# Patient Record
Sex: Male | Born: 1960 | Race: Black or African American | Hispanic: No | Marital: Single
Health system: Southern US, Community
[De-identification: ages and names within clinical notes are randomized; demographics above are authoritative.]

---

## 2009-05-02 ENCOUNTER — Inpatient Hospital Stay: Payer: Self-pay | Admitting: Unknown Physician Specialty

## 2009-05-12 ENCOUNTER — Inpatient Hospital Stay: Payer: Self-pay | Admitting: Unknown Physician Specialty

## 2009-07-01 ENCOUNTER — Inpatient Hospital Stay: Payer: Self-pay | Admitting: Psychiatry

## 2011-09-04 ENCOUNTER — Inpatient Hospital Stay: Payer: Self-pay | Admitting: Psychiatry

## 2011-09-04 LAB — COMPREHENSIVE METABOLIC PANEL
Albumin: 4 g/dL (ref 3.4–5.0)
Bilirubin,Total: 0.8 mg/dL (ref 0.2–1.0)
Chloride: 103 mmol/L (ref 98–107)
Co2: 24 mmol/L (ref 21–32)
Creatinine: 1.34 mg/dL — ABNORMAL HIGH (ref 0.60–1.30)
EGFR (African American): >60
EGFR (Non-African Amer.): >60
Osmolality: 281 (ref 275–301)
SGOT(AST): 46 U/L — ABNORMAL HIGH (ref 15–37)
SGPT (ALT): 24 U/L
Sodium: 139 mmol/L (ref 136–145)
Total Protein: 8.3 g/dL — ABNORMAL HIGH (ref 6.4–8.2)

## 2011-09-04 LAB — URINALYSIS, COMPLETE
Bilirubin,UR: NEGATIVE
Glucose,UR: NEGATIVE mg/dL (ref 0–75)
Nitrite: NEGATIVE
Protein: NEGATIVE
RBC,UR: 2 /HPF (ref 0–5)
Specific Gravity: 1.028 (ref 1.003–1.030)
Squamous Epithelial: NONE SEEN
WBC UR: 1 /HPF (ref 0–5)

## 2011-09-04 LAB — TROPONIN I: Troponin-I: <0.02 ng/mL

## 2011-09-04 LAB — SALICYLATE LEVEL: Salicylates, Serum: <1.7 mg/dL

## 2011-09-04 LAB — DRUG SCREEN, URINE
Amphetamines, Ur Screen: NEGATIVE (ref ?–1000)
Cannabinoid 50 Ng, Ur ~~LOC~~: NEGATIVE (ref ?–50)
Cocaine Metabolite,Ur ~~LOC~~: POSITIVE (ref ?–300)
MDMA (Ecstasy)Ur Screen: NEGATIVE (ref ?–500)
Opiate, Ur Screen: NEGATIVE (ref ?–300)

## 2011-09-04 LAB — CBC
HCT: 52 % (ref 40.0–52.0)
HGB: 16.5 g/dL (ref 13.0–18.0)
MCH: 27.4 pg (ref 26.0–34.0)
MCHC: 31.8 g/dL — ABNORMAL LOW (ref 32.0–36.0)
RDW: 14.7 % — ABNORMAL HIGH (ref 11.5–14.5)
WBC: 9.9 10*3/uL (ref 3.8–10.6)

## 2011-09-04 LAB — ETHANOL
Ethanol %: <0.003 % (ref 0.000–0.080)
Ethanol: <3 mg/dL

## 2011-09-04 LAB — CK TOTAL AND CKMB (NOT AT ARMC)
CK, Total: 498 U/L — ABNORMAL HIGH (ref 35–232)
CK-MB: 4.1 ng/mL — ABNORMAL HIGH (ref 0.5–3.6)

## 2011-09-04 LAB — ACETAMINOPHEN LEVEL: Acetaminophen: <2 ug/mL

## 2011-09-04 LAB — CK: CK, Total: 492 U/L — ABNORMAL HIGH (ref 35–232)

## 2011-09-05 LAB — BASIC METABOLIC PANEL
BUN: 25 mg/dL — ABNORMAL HIGH (ref 7–18)
Chloride: 105 mmol/L (ref 98–107)
Co2: 26 mmol/L (ref 21–32)
Creatinine: 1.5 mg/dL — ABNORMAL HIGH (ref 0.60–1.30)
Potassium: 4 mmol/L (ref 3.5–5.1)

## 2011-09-05 LAB — FOLATE: Folic Acid: 16.7 ng/mL (ref 3.1–100.0)

## 2011-09-06 LAB — BASIC METABOLIC PANEL
BUN: 11 mg/dL (ref 7–18)
Calcium, Total: 9.1 mg/dL (ref 8.5–10.1)
Chloride: 104 mmol/L (ref 98–107)
Co2: 27 mmol/L (ref 21–32)
Creatinine: 1.03 mg/dL (ref 0.60–1.30)
EGFR (Non-African Amer.): >60
Glucose: 125 mg/dL — ABNORMAL HIGH (ref 65–99)
Osmolality: 280 (ref 275–301)
Potassium: 3.9 mmol/L (ref 3.5–5.1)
Sodium: 140 mmol/L (ref 136–145)

## 2013-04-13 ENCOUNTER — Emergency Department: Payer: Self-pay | Admitting: Emergency Medicine

## 2013-04-13 LAB — URINALYSIS, COMPLETE
BACTERIA: NONE SEEN
BILIRUBIN, UR: NEGATIVE
GLUCOSE, UR: NEGATIVE mg/dL (ref 0–75)
KETONE: NEGATIVE
NITRITE: NEGATIVE
Ph: 6 (ref 4.5–8.0)
Protein: 100
Specific Gravity: 1.019 (ref 1.003–1.030)
Squamous Epithelial: <1

## 2013-06-17 ENCOUNTER — Emergency Department: Payer: Self-pay | Admitting: Emergency Medicine

## 2013-06-17 LAB — COMPREHENSIVE METABOLIC PANEL
Albumin: 3.4 g/dL (ref 3.4–5.0)
Alkaline Phosphatase: 50 U/L
Anion Gap: 5 — ABNORMAL LOW (ref 7–16)
BILIRUBIN TOTAL: 0.4 mg/dL (ref 0.2–1.0)
BUN: 17 mg/dL (ref 7–18)
CO2: 30 mmol/L (ref 21–32)
Calcium, Total: 9 mg/dL (ref 8.5–10.1)
Chloride: 106 mmol/L (ref 98–107)
Creatinine: 1.31 mg/dL — ABNORMAL HIGH (ref 0.60–1.30)
EGFR (African American): >60
GLUCOSE: 78 mg/dL (ref 65–99)
Osmolality: 282 (ref 275–301)
POTASSIUM: 3.9 mmol/L (ref 3.5–5.1)
SGOT(AST): 27 U/L (ref 15–37)
SGPT (ALT): 19 U/L (ref 12–78)
SODIUM: 141 mmol/L (ref 136–145)
Total Protein: 7.2 g/dL (ref 6.4–8.2)

## 2013-06-17 LAB — URINALYSIS, COMPLETE
BACTERIA: NONE SEEN
BLOOD: NEGATIVE
Bilirubin,UR: NEGATIVE
GLUCOSE, UR: NEGATIVE mg/dL (ref 0–75)
KETONE: NEGATIVE
LEUKOCYTE ESTERASE: NEGATIVE
Nitrite: NEGATIVE
PH: 5 (ref 4.5–8.0)
PROTEIN: NEGATIVE
RBC,UR: <1 /HPF (ref 0–5)
Specific Gravity: 1.024 (ref 1.003–1.030)
Squamous Epithelial: NONE SEEN
WBC UR: 1 /HPF (ref 0–5)

## 2013-06-17 LAB — CBC
HCT: 45.6 % (ref 40.0–52.0)
HGB: 14.2 g/dL (ref 13.0–18.0)
MCH: 26.7 pg (ref 26.0–34.0)
MCHC: 31.3 g/dL — ABNORMAL LOW (ref 32.0–36.0)
MCV: 85 fL (ref 80–100)
PLATELETS: 172 10*3/uL (ref 150–440)
RBC: 5.34 10*6/uL (ref 4.40–5.90)
RDW: 15.4 % — ABNORMAL HIGH (ref 11.5–14.5)
WBC: 5.6 10*3/uL (ref 3.8–10.6)

## 2013-06-19 LAB — URINE CULTURE

## 2013-10-06 LAB — URINALYSIS, COMPLETE
BILIRUBIN, UR: NEGATIVE
Blood: NEGATIVE
Glucose,UR: 50 mg/dL (ref 0–75)
Leukocyte Esterase: NEGATIVE
Nitrite: NEGATIVE
Ph: 5 (ref 4.5–8.0)
Protein: 30
RBC,UR: 4 /HPF (ref 0–5)
SQUAMOUS EPITHELIAL: NONE SEEN
Specific Gravity: 1.024 (ref 1.003–1.030)

## 2013-10-06 LAB — ACETAMINOPHEN LEVEL: Acetaminophen: <2 ug/mL

## 2013-10-06 LAB — COMPREHENSIVE METABOLIC PANEL
ALBUMIN: 3.9 g/dL (ref 3.4–5.0)
ALT: 30 U/L
ANION GAP: 9 (ref 7–16)
Alkaline Phosphatase: 54 U/L
BUN: 18 mg/dL (ref 7–18)
Bilirubin,Total: 1.5 mg/dL — ABNORMAL HIGH (ref 0.2–1.0)
CHLORIDE: 103 mmol/L (ref 98–107)
CO2: 25 mmol/L (ref 21–32)
CREATININE: 1.4 mg/dL — AB (ref 0.60–1.30)
Calcium, Total: 8.8 mg/dL (ref 8.5–10.1)
EGFR (Non-African Amer.): 57 — ABNORMAL LOW
Glucose: 194 mg/dL — ABNORMAL HIGH (ref 65–99)
Osmolality: 281 (ref 275–301)
Potassium: 3.7 mmol/L (ref 3.5–5.1)
SGOT(AST): 53 U/L — ABNORMAL HIGH (ref 15–37)
Sodium: 137 mmol/L (ref 136–145)
Total Protein: 8 g/dL (ref 6.4–8.2)

## 2013-10-06 LAB — CBC
HCT: 51.1 % (ref 40.0–52.0)
HGB: 16.2 g/dL (ref 13.0–18.0)
MCH: 27.3 pg (ref 26.0–34.0)
MCHC: 31.7 g/dL — ABNORMAL LOW (ref 32.0–36.0)
MCV: 86 fL (ref 80–100)
PLATELETS: 180 10*3/uL (ref 150–440)
RBC: 5.94 10*6/uL — AB (ref 4.40–5.90)
RDW: 15.2 % — AB (ref 11.5–14.5)
WBC: 7.7 10*3/uL (ref 3.8–10.6)

## 2013-10-06 LAB — SALICYLATE LEVEL

## 2013-10-06 LAB — ETHANOL: Ethanol %: <0.003 % (ref 0.000–0.080)

## 2013-10-07 ENCOUNTER — Inpatient Hospital Stay: Payer: Self-pay | Admitting: Psychiatry

## 2013-10-07 LAB — DRUG SCREEN, URINE
AMPHETAMINES, UR SCREEN: NEGATIVE (ref ?–1000)
Barbiturates, Ur Screen: NEGATIVE (ref ?–200)
Benzodiazepine, Ur Scrn: NEGATIVE (ref ?–200)
CANNABINOID 50 NG, UR ~~LOC~~: POSITIVE (ref ?–50)
COCAINE METABOLITE, UR ~~LOC~~: POSITIVE (ref ?–300)
MDMA (ECSTASY) UR SCREEN: NEGATIVE (ref ?–500)
Methadone, Ur Screen: NEGATIVE (ref ?–300)
Opiate, Ur Screen: NEGATIVE (ref ?–300)
Phencyclidine (PCP) Ur S: NEGATIVE (ref ?–25)
Tricyclic, Ur Screen: NEGATIVE (ref ?–1000)

## 2013-10-09 LAB — URINALYSIS, COMPLETE
BACTERIA: NONE SEEN
BILIRUBIN, UR: NEGATIVE
Blood: NEGATIVE
GLUCOSE, UR: NEGATIVE mg/dL (ref 0–75)
Ketone: NEGATIVE
LEUKOCYTE ESTERASE: NEGATIVE
Nitrite: NEGATIVE
Ph: 7 (ref 4.5–8.0)
Protein: NEGATIVE
RBC, UR: NONE SEEN /HPF (ref 0–5)
SPECIFIC GRAVITY: 1.006 (ref 1.003–1.030)
Squamous Epithelial: <1
WBC UR: NONE SEEN /HPF (ref 0–5)
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
## 2014-06-11 NOTE — Consult Note (Signed)
Brief Consult Note: Diagnosis: substance induced mood disorder, alcohol dependence, cocaine abuse.   Patient was seen by consultant.   Consult note dictated.   Recommend further assessment or treatment.   Orders entered.   Comments: Psychiatry: Patient see. Full note to follow. PAtient reports active suicidal ideation and also a past history of suicidal behavior. Multiple suicide risk factors current and going forward esp outside hospital. Also alcohol dependence with possible hx DTs. Admission done.  Electronic Signatures: Audery Amellapacs, Danielle Lento T (MD)  (Signed 20-Aug-15 10:52)  Authored: Brief Consult Note   Last Updated: 20-Aug-15 10:52 by Audery Amellapacs, Iasiah Ozment T (MD)

## 2014-06-11 NOTE — H&P (Signed)
PATIENT NAME:  Hector Reynolds, Hector Reynolds MR#:  [id number] DATE OF BIRTH:  October 03, 1960  DATE OF ADMISSION:  10/07/2013  DATE OF ASSESSMENT: 10/08/2013  REFERRING PHYSICIAN: Emergency Room MD.   ATTENDING PHYSICIAN: Jr Milliron B. Heidie Krall, MD.   IDENTIFYING DATA: The patient is a 54 year old male with history of depression and substance abuse.   CHIEF COMPLAINT: "I need help with detox."   HISTORY OF PRESENT ILLNESS: The patient has a long history of drinking, using prescription pills, and cocaine. Apparently, cocaine is his major vice. He has been using a couple of grams a day recently and drinking up to 6 beers a day as well. He is currently homeless, stays with friends who are also using and is not allowed to return to the homeless shelter in [place]. He reports poor sleep, decreased appetite, anhedonia, feeling of guilt, hopelessness, worthlessness, poor energy, poor memory and concentration, social isolation, crying spells and now suicidal ideation with a plan to overdose. There is a history of several suicide attempts by overdose in the past on Xanax pain killers and cocaine. The patient has never been compliant with outpatient psychiatric treatment. Even though he has been placed on Remeron, Wellbutrin, and Celexa in the past, he never continued in the community. He had several admissions to [facility] for detoxification. He went to [facility] in the past, and 2 or 3 years ago he went to [facility] treatment facility in [place]. He reports that he really did not maintain any sobriety then. He denies any psychotic symptoms. There are no symptoms suggestive of bipolar mania. He admits to drinking and using cocaine, but no prescription pills or other drugs.   PAST PSYCHIATRIC HISTORY: As above, multiple hospitalizations for detoxification,  treatment at [facility] and [facility]. There were suicide attempts in the past and a history of treatment noncompliance in the community.   FAMILY PSYCHIATRIC  HISTORY: Multiple family members with drug problems.   PAST MEDICAL HISTORY: GERD.   ALLERGIES: No known drug allergies.   MEDICATIONS ON ADMISSION: None.   SOCIAL HISTORY: He is originally from [facility]. He is homeless, stays with his drug buddies. He wants to change his life now. He has no income or health insurance.   REVIEW OF SYSTEMS: CONSTITUTIONAL: No fevers or chills. Positive for weight loss.  EYES: No double or blurred vision.  ENT: No hearing loss.  RESPIRATORY: No shortness of breath or cough.  CARDIOVASCULAR: No chest pain or orthopnea.  GASTROINTESTINAL: No abdominal pain, nausea, vomiting, or diarrhea.  GENITOURINARY: No incontinence or frequency.  ENDOCRINE: No heat or cold intolerance.  LYMPHATIC: No anemia or easy bruising.  INTEGUMENTARY: No acne or rash.  MUSCULOSKELETAL: No muscle or joint pain.  NEUROLOGIC: No tingling or weakness.  PSYCHIATRIC: See History of Present Illness for details.   PHYSICAL EXAMINATION:  VITAL SIGNS: Blood pressure 149/94, pulse 51, respirations 18, temperature 98.  GENERAL: This is a slender, elderly gentleman looking older than stated age, in no acute distress.  HEENT: The pupils are equal, round, and reactive to light. Sclerae are anicteric.  NECK: Supple. No thyromegaly.  LUNGS: Clear to auscultation. No dullness to percussion.  HEART: Regular rhythm and rate. No murmurs, rubs, or gallops.  ABDOMEN: Soft, nontender, nondistended. Positive bowel sounds.  MUSCULOSKELETAL: Normal muscle strength in all extremities.  SKIN: No rashes or bruises.  LYMPHATIC: No cervical adenopathy.  NEUROLOGIC: Cranial nerves II through XII are intact.   LABORATORY DATA: Chemistries are within normal limits except for blood glucose  of 194, creatinine 1.4. Blood alcohol level is zero. LFTs are within normal limits, except for total bilirubin of 1.5 and AST of 53. Urine toxicology screen is positive for cocaine and marijuana. CBC is within normal  limits. Urinalysis is not suggestive of urinary tract infection. Serum acetaminophen and salicylates are low.   MENTAL STATUS EXAMINATION: On admission, the patient is alert and oriented to person, place, time and situation. He is pleasant, polite and cooperative. He is adequately groomed, wearing hospital scrubs. He maintains good eye contact. His speech is of normal rhythm, rate and volume. Mood is depressed with flat affect. Thought process is logical and goal oriented. He denies thoughts of hurting himself or others now and is able to contract for safety in the hospital, but was suicidal on admission. There are no delusions or paranoia. There are no auditory or visual hallucinations. His cognition is grossly intact. Registration, recall, short and long-term memory are intact. He is of normal intelligence and fund of knowledge. His insight and judgment are questionable.   SUICIDE RISK ASSESSMENT: On admission, this is a patient with a long history of substance abuse and mood instability, who came to the hospital asking for detoxification from alcohol and cocaine, who is trying to straighten his life up. He became depressed and suicidal in the context of multiple social stressors.   INITIAL DIAGNOSES: AXIS I: Substance-induced mood disorder, alcohol dependence, cocaine dependence.  AXIS II: Deferred.  AXIS III: Gastroesophageal reflux disease.  AXIS IV: Mental illness, substance abuse, employment, financial, housing, primary support, access to care.  AXIS V: Global assessment of functioning, 25.   PLAN: The patient was admitted to [facility] for safety, stabilization and medication management. He was initially placed on suicide precautions and was closely monitored for any unsafe behaviors. He underwent full psychiatric and risk assessment. He received pharmacotherapy, individual and group psychotherapy, substance abuse counseling, and support from  therapeutic milieu.  1. Suicidal ideation. He is able to contract for safety.  2. Alcohol detoxification: He is on the [facility]WA protocol. We will monitor for symptoms of withdrawal.  3. Gastroesophageal reflux disease. Will start Prilosec.  4. Mood. The patient is never noncompliant with medications. Last time, he was discharged on Celexa. We will restart Celexa.  5. Substance abuse treatment. The patient is [facility] in [facility]. Social worker to make a referral.   DISPOSITION: To be established. Unfortunately, he is homeless and not allowed to return to our shelter.     ____________________________ Ellin GoodieJolanta B. Jennet MaduroPucilowska, MD jbp:ls D: 10/08/2013 13:36:57 ET T: 10/08/2013 15:03:47 ET JOB#: [id number]  cc: Rexanna Louthan B. Jennet MaduroPucilowska, MD, <Dictator> Shari ProwsJOLANTA B Aneshia Jacquet MD ELECTRONICALLY SIGNED 10/12/2013 19:12

## 2014-06-11 NOTE — Consult Note (Signed)
PATIENT NAME:  Hector Reynolds, Hector Reynolds MR#:  [id number] DATE OF BIRTH:  Jul 05, 1960  DATE OF CONSULTATION:  10/07/2013  REFERRING PHYSICIAN:   CONSULTING PHYSICIAN:  Audery Amel, MD  IDENTIFYING INFORMATION AND REASON FOR CONSULT: This is a 54 year old man with a long history of substance abuse and mood disorder who presented to the Emergency Room stating "I need help."   HISTORY OF PRESENT ILLNESS: Information obtained from the patient and the chart. The patient is telling me that he has been smoking large amounts of crack daily, probably 2-3 grams a day. He has been feeling increasingly depressed. He has been having suicidal thoughts with thoughts of overdosing or doing something else to harm himself. Mood has been depressed and anxious. Sleep pattern is poor. Appetite is poor. He also tells me that he has been having some visual hallucinations, especially in the last day since stopping drinking. He is also drinking alcohol, about 5-6 beers a day. Last drink was the night before last. He says that he is not sure if he has ever had a seizure, but thinks he has had DTs and gets bad shakes when he is coming off the alcohol. Multiple major stresses in his life, has no place to live, has no family support.   PAST PSYCHIATRIC HISTORY: The patient has had admissions to the hospital in the past under similar circumstances. He reports that he has tried to kill himself by overdose many times in the past. He has been referred to the [facility] drug [facility] treatment center once. He stayed there for 20 days but did not maintain sobriety afterwards. He has never stayed compliant with antidepressant medicine.   FAMILY HISTORY: Multiple people with alcohol abuse.   SOCIAL HISTORY: The patient currently has no place to stay. He is banned from the shelter. Says that he has "burned his bridges" with all of his family. The only place he has been able to stay has been with other people who are using cocaine.   PAST MEDICAL  HISTORY: He has acid reflux. No other significant medical problems.   CURRENT MEDICATIONS: Protonix, unknown dose daily.   ALLERGIES: No known drug allergies.   REVIEW OF SYSTEMS: Depressed mood, suicidal ideation, tremulousness, shakiness, poor sleep, poor appetite, visual hallucinations. Denies homicidal ideation. No other physical complaints.   MENTAL STATUS EXAMINATION: Slightly disheveled man who looks his stated age. Passively cooperative with the interview. Eye contact intermittent. Psychomotor activity slow. Speech slow and decreased in amount. Affect: The patient endorses suicidal ideation with thoughts of overdosing. Denies homicidal ideation. Denies auditory hallucinations but endorses visual hallucinations, seeing people walking by or standing in his doorway. He is alert and oriented x 4. Memory 2/3 objects at 2 minutes. Long-term memory intact. Appears to be of normal intelligence.   LABORATORY RESULTS: Urinalysis positive for protein, otherwise unremarkable. Salicylates negative. CBC: Shows no significant abnormalities. Alcohol negative. Glucose elevated at 194, creatinine elevated at 1.4. Bilirubin elevated at 1.5, AST elevated at 53. Acetaminophen negative. Somehow it was overlooked to get a urine drug screen on this gentleman.   PHYSICAL EXAMINATION:  GENERAL: Full physical not completed for consult, but the patient appears to have a mild tremor all over, especially with intention. He was a little bit slow on his feet. No acute skin lesions identified.  VITAL SIGNS: Blood pressure 149/93, respirations 18, pulse 59 temperature 98.2.   ASSESSMENT: A 55 year old man with depressed mood with suicidal ideation, history of suicide attempts, multiple major life stressors and  suicide risk factors. Additionally, he has been abusing alcohol and has elevated blood pressure, some laboratory studies to indicate liver damage from drinking and a possible history of delirium tremens. Also abusing  large amounts of cocaine. The patient warrants admission for suicidality and alcohol withdrawal.   TREATMENT PLAN: Admission done to the Behavioral Health Unit. Alcohol withdrawal protocol in place. Suicide and close precautions in place. Team downstairs can work on followup treatment.   DIAGNOSIS, PRINCIPAL AND PRIMARY:  AXIS I:  1.  Substance-induced mood disorder, depressed with suicidal ideation.  2.  Alcohol dependence. 3.  Cocaine dependence.  AXIS II: Deferred.  AXIS III: History of gastric reflux symptoms.  AXIS IV: Severe homelessness.  AXIS V: Functioning at time of evaluation 30.    ____________________________ Audery AmelJohn T. Easton Fetty, MD jtc:TT D: 10/07/2013 11:06:30 ET T: 10/07/2013 14:31:09 ET JOB#: [id number]  cc: Audery AmelJohn T. Ricci Paff, MD, <Dictator> Audery AmelJOHN T Tija Biss MD ELECTRONICALLY SIGNED 10/29/2013 16:58

## 2014-07-08 IMAGING — CT CT STONE STUDY
3 of 4 series · 5 of 16 positions shown, 6 images · non-contrast
Comparison: None.

CLINICAL DATA: Dysuria.  Right flank pain.

EXAM:
CT ABDOMEN AND PELVIS WITHOUT CONTRAST
TECHNIQUE: Multidetector CT imaging of the abdomen and pelvis was performed
following the standard protocol without IV contrast.

[Series 4: lung · axial · 0.79mm/px · z∈[-645,-645]mm · 1 of 27 slices shown, 2 images]
[im 1/27  soft-tissue]
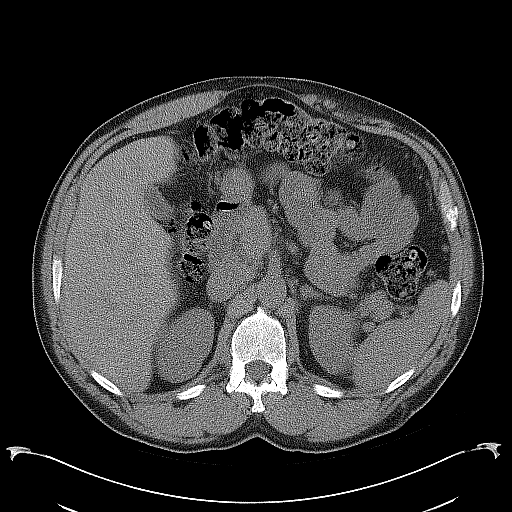
[im 1/27  bone]
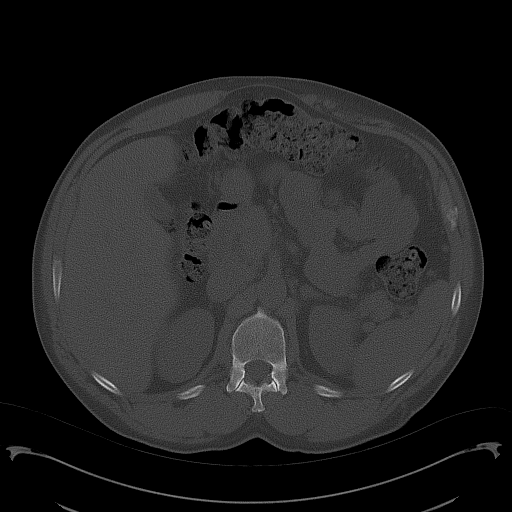

[Series 5: coronal · coronal · 0.92mm/px · 3 of 135 slices shown]
[im 34/135  soft-tissue]
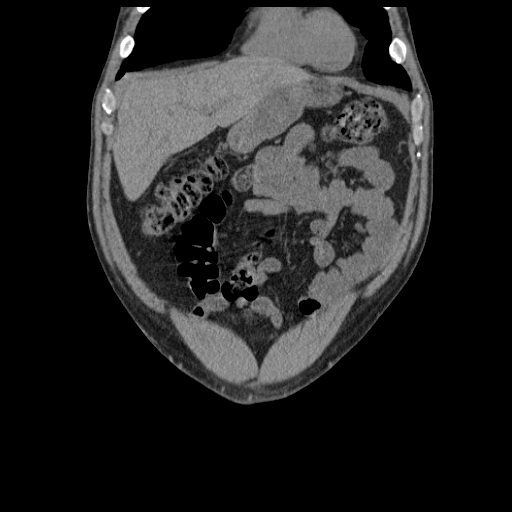
[im 68/135  soft-tissue]
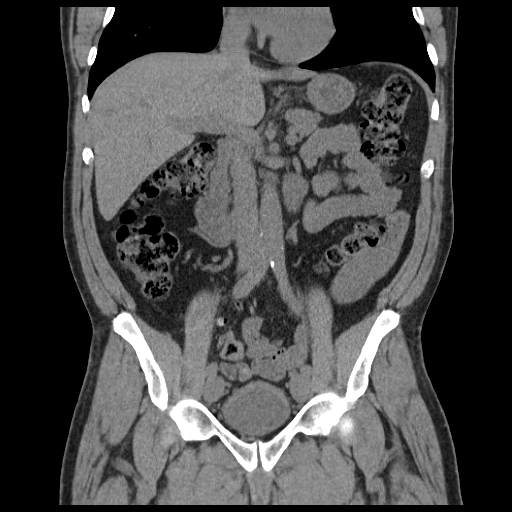
[im 101/135  soft-tissue]
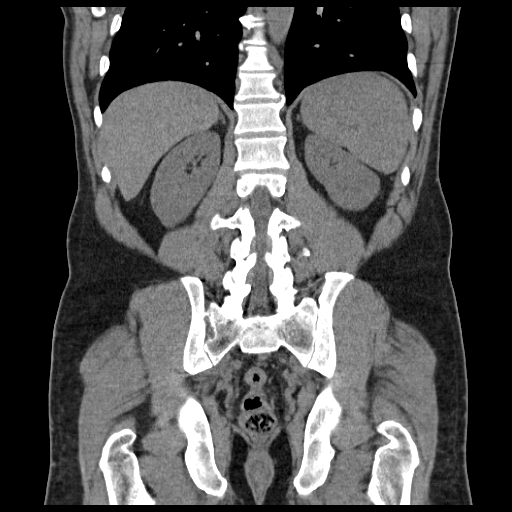

[Series 6: sagittal · sagittal · 0.68mm/px · 1 of 177 slices shown]
[im 89/177  soft-tissue]
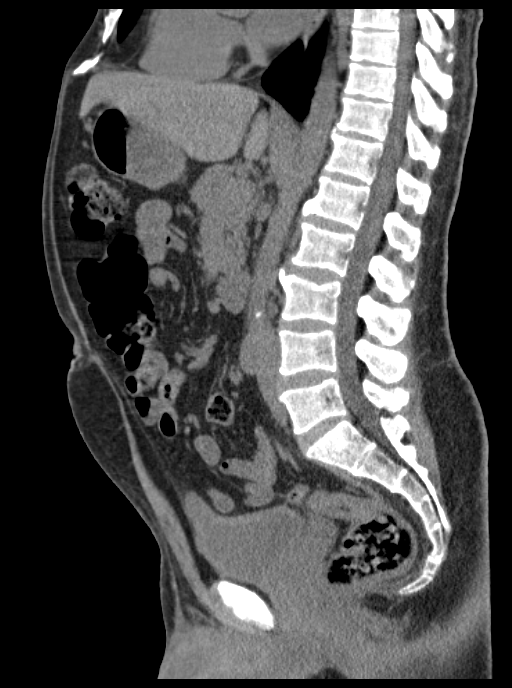

[5 of 16 positions shown; findings below may reference images not displayed]

FINDINGS: No focal abnormality is seen in the liver or spleen on this study
performed without intravenous contrast material. The stomach,
duodenum, pancreas, gallbladder, and adrenal glands are
unremarkable.

No stones are seen in either kidney. No ureteral or bladder stones.
No secondary changes in either kidney or ureter.

No abdominal aortic aneurysm. There is no free fluid or
lymphadenopathy in the abdomen.

Imaging through the pelvis shows no free intraperitoneal fluid. The
external iliac veins are prominent bilaterally, but no definite
pelvic sidewall lymphadenopathy is evident. No substantial
diverticular disease in the colon. No colonic diverticulitis. The
terminal ileum and the appendix are normal.

Left inguinal hernia contains only fat.

Bone windows reveal no worrisome lytic or sclerotic osseous lesions.
IMPRESSION: No acute findings. No CT evidence to explain the patient's history
of dysuria and right flank pain. .

## 2014-07-08 IMAGING — US US EXTREM LOW VENOUS*L*
1 series · 14 of 24 positions shown · non-contrast
Comparison: None.

CLINICAL DATA: Leg swelling.

EXAM:
Left LOWER EXTREMITY VENOUS DOPPLER ULTRASOUND
TECHNIQUE: Gray-scale sonography with graded compression, as well as color
Doppler and duplex ultrasound, were performed to evaluate the deep
venous system from the level of the common femoral vein through the
popliteal and proximal calf veins. Spectral Doppler was utilized to
evaluate flow at rest and with distal augmentation maneuvers.

[Series 1: us extrem low venous*left* · 0.10mm/px · 14 of 35 slices shown]
[im 1/35]
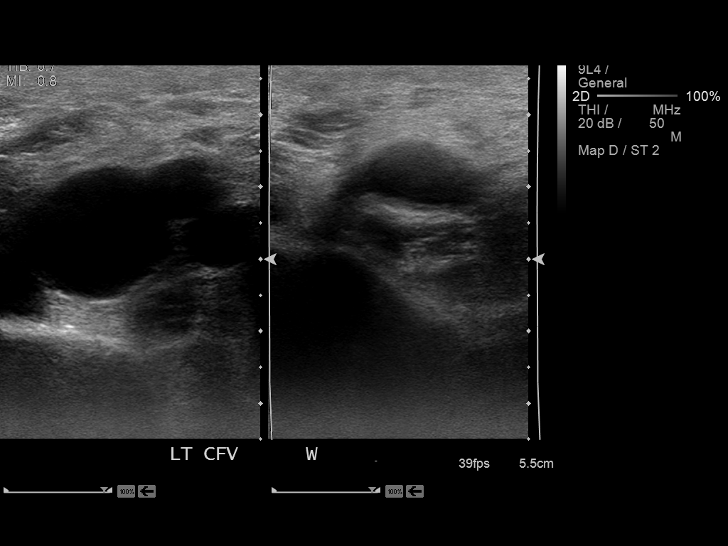
[im 3/35]
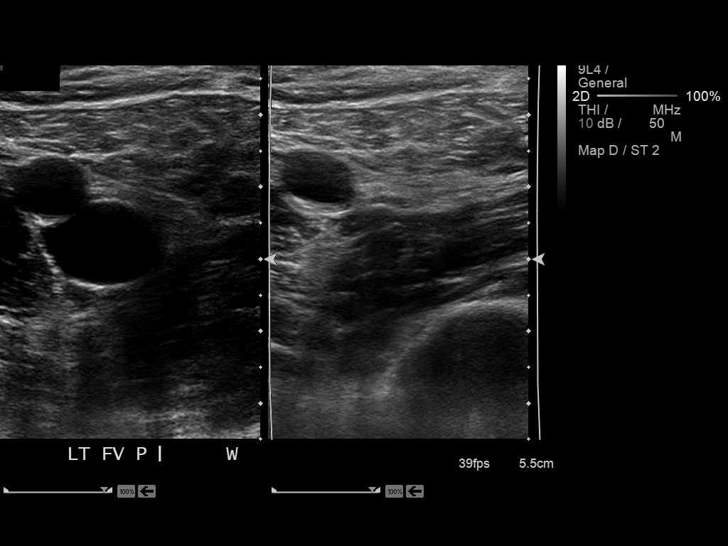
[im 6/35]
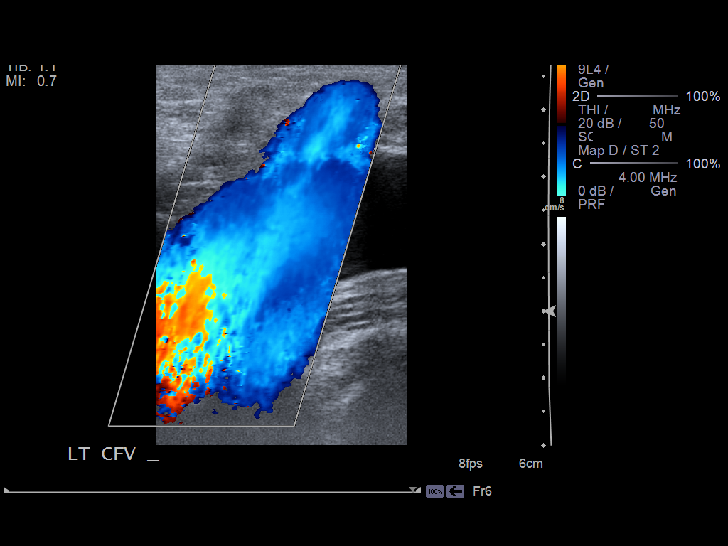
[im 9/35]
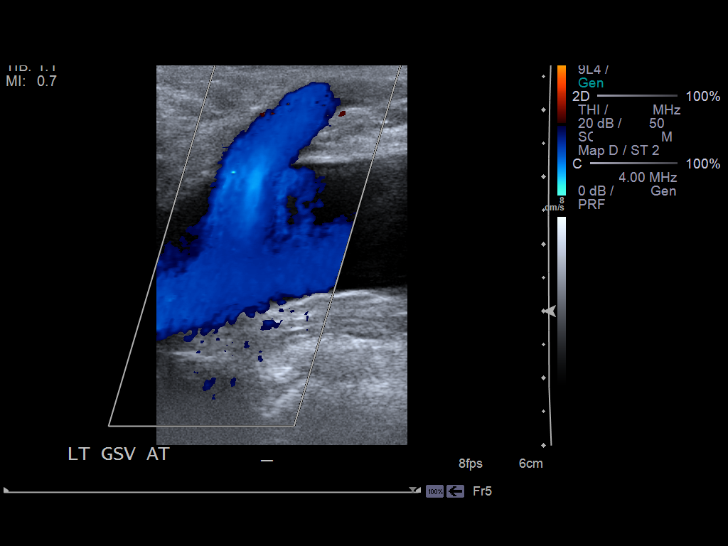
[im 11/35]
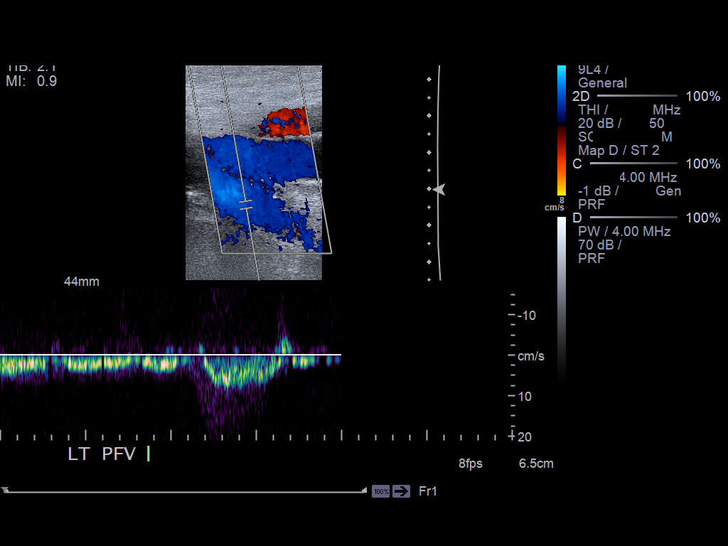
[im 14/35]
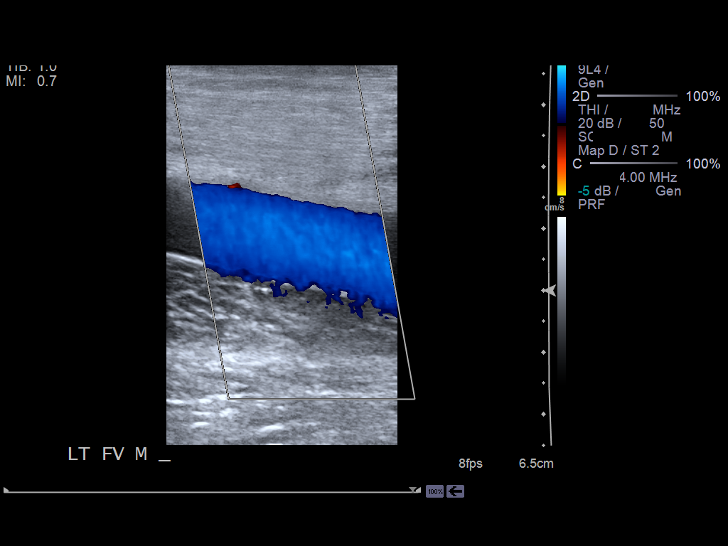
[im 17/35]
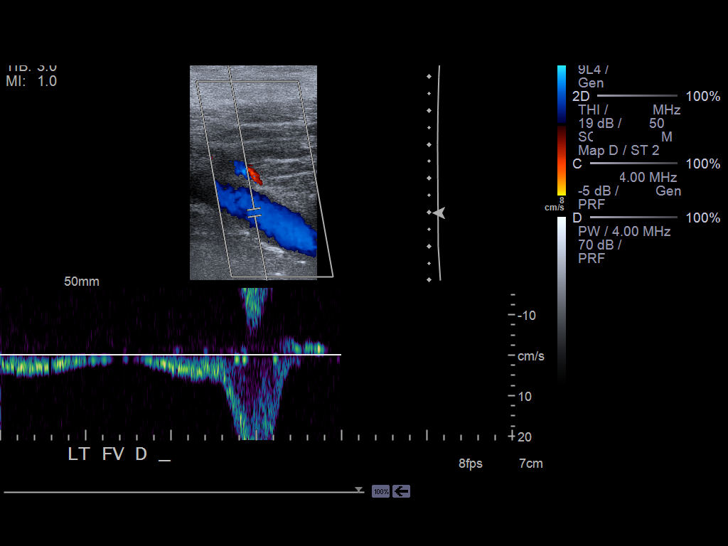
[im 18/35]
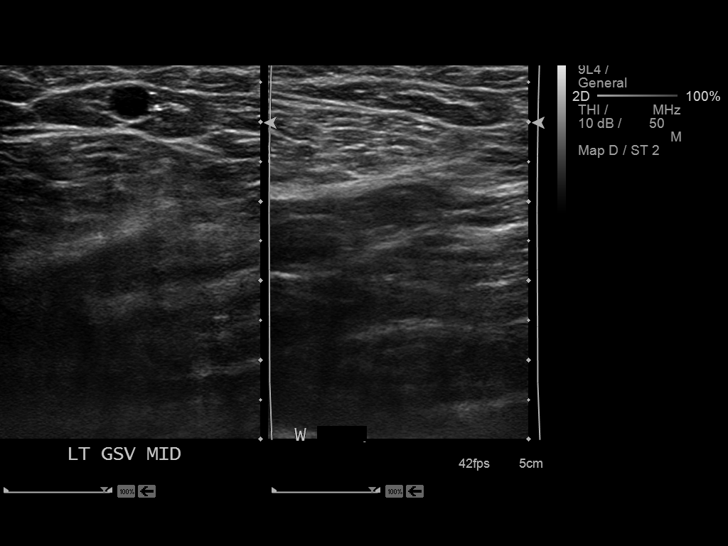
[im 21/35]
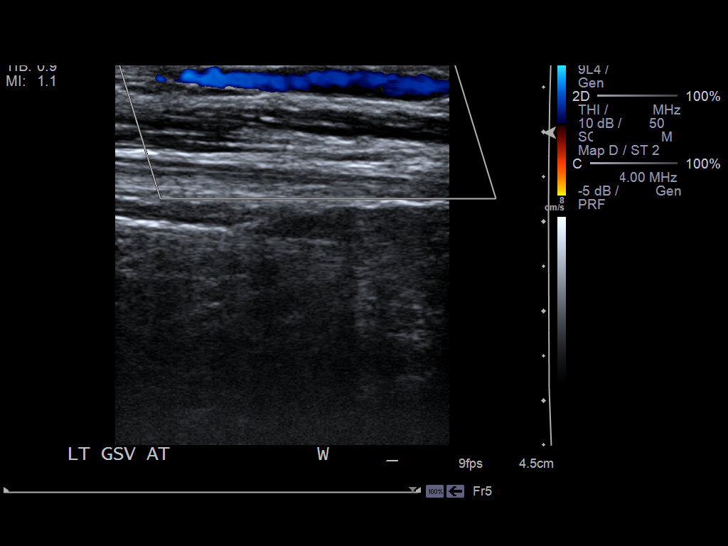
[im 24/35]
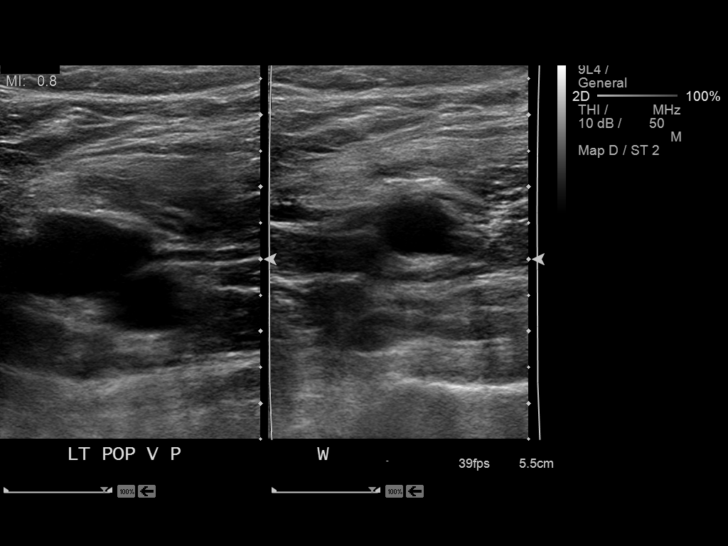
[im 27/35]
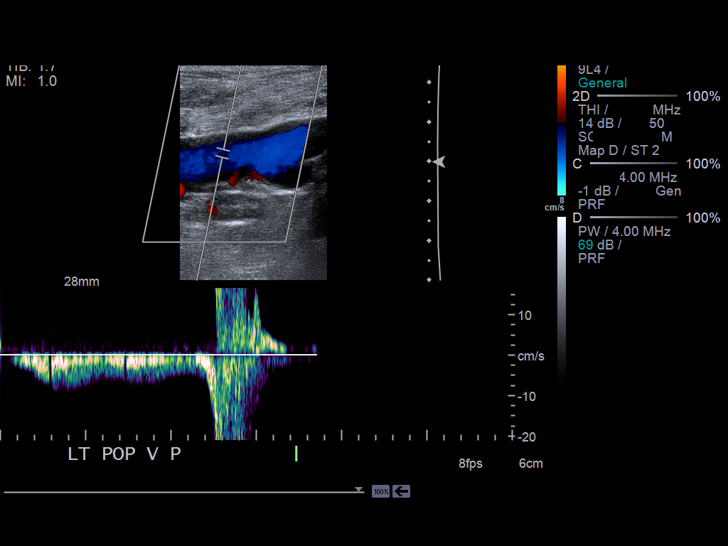
[im 29/35]
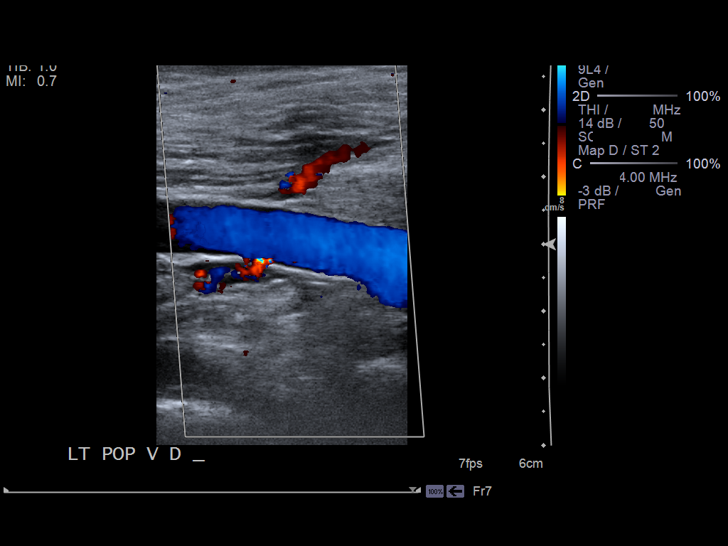
[im 32/35]
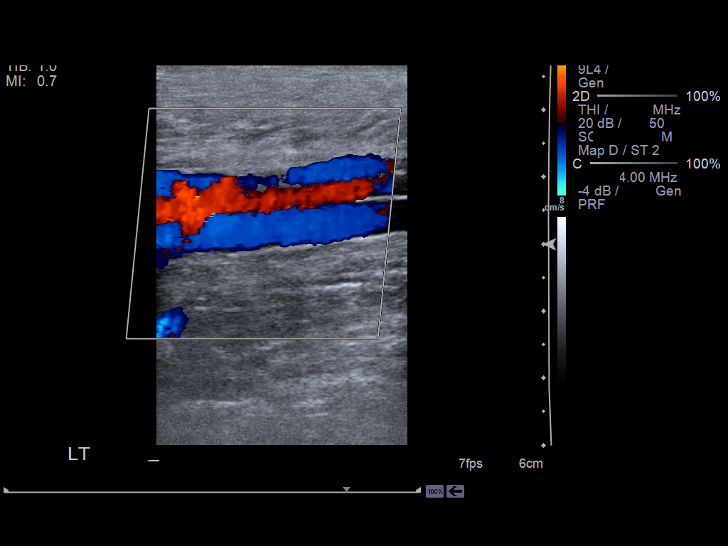
[im 35/35]
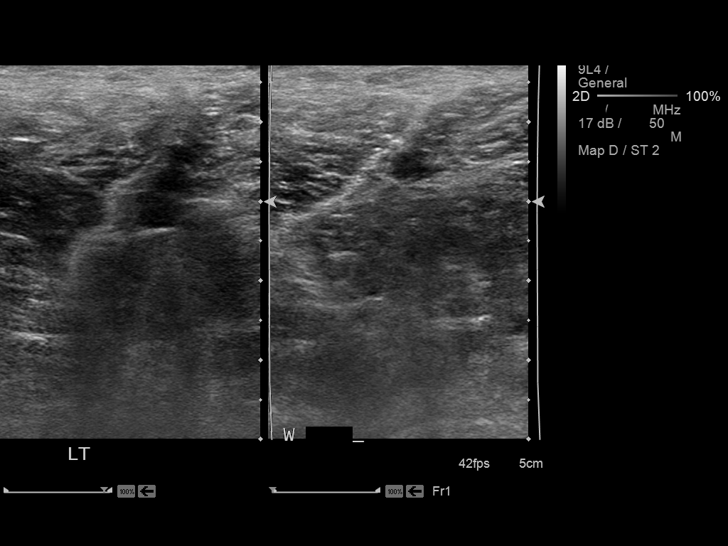

[14 of 24 positions shown; findings below may reference images not displayed]

FINDINGS: Thrombus within deep veins:  None visualized.

Compressibility of deep veins:  Normal.

Duplex waveform respiratory phasicity:  Normal.

Duplex waveform response to augmentation:  Normal.

Venous reflux:  None visualized.

Other findings:  None visualized.
IMPRESSION: Negative left lower extremity DVT study.
